# Patient Record
Sex: Male | Born: 1965 | Race: White | Hispanic: No | Marital: Married
Health system: Southern US, Community
[De-identification: ages and names within clinical notes are randomized; demographics above are authoritative.]

## PROBLEM LIST (undated history)

## (undated) DIAGNOSIS — I10 Essential (primary) hypertension: Secondary | ICD-10-CM

---

## 2003-05-30 ENCOUNTER — Emergency Department (HOSPITAL_COMMUNITY): Admission: EM | Admit: 2003-05-30 | Discharge: 2003-05-30 | Payer: Self-pay | Admitting: Emergency Medicine

## 2003-05-30 ENCOUNTER — Encounter: Payer: Self-pay | Admitting: Emergency Medicine

## 2013-08-09 ENCOUNTER — Emergency Department: Payer: Self-pay | Admitting: Emergency Medicine

## 2016-06-24 ENCOUNTER — Emergency Department: Payer: Commercial Managed Care - PPO

## 2016-06-24 ENCOUNTER — Emergency Department
Admission: EM | Admit: 2016-06-24 | Discharge: 2016-06-24 | Disposition: A | Discharge status: Home / Self Care | Payer: Commercial Managed Care - PPO | Attending: Emergency Medicine | Admitting: Emergency Medicine

## 2016-06-24 DIAGNOSIS — S8392XA Sprain of unspecified site of left knee, initial encounter: Secondary | ICD-10-CM

## 2016-06-24 DIAGNOSIS — M25562 Pain in left knee: Secondary | ICD-10-CM | POA: Diagnosis present

## 2016-06-24 DIAGNOSIS — Y998 Other external cause status: Secondary | ICD-10-CM | POA: Insufficient documentation

## 2016-06-24 DIAGNOSIS — Y9344 Activity, trampolining: Secondary | ICD-10-CM | POA: Insufficient documentation

## 2016-06-24 DIAGNOSIS — I10 Essential (primary) hypertension: Secondary | ICD-10-CM | POA: Insufficient documentation

## 2016-06-24 DIAGNOSIS — Y929 Unspecified place or not applicable: Secondary | ICD-10-CM | POA: Diagnosis not present

## 2016-06-24 DIAGNOSIS — X501XXA Overexertion from prolonged static or awkward postures, initial encounter: Secondary | ICD-10-CM | POA: Insufficient documentation

## 2016-06-24 HISTORY — DX: Essential (primary) hypertension: I10

## 2016-06-24 MED ORDER — NAPROXEN 500 MG PO TABS: 500.0000 mg | ORAL_TABLET | Freq: Two times a day (BID) | ORAL | 0 refills | Status: AC

## 2016-06-24 NOTE — ED Notes (Signed)
States he developed pain to left knee after jumping on trampoline last pm  Thinks he may have hyper extended his knee  Pain is lateral  Min swelling

## 2016-06-24 NOTE — ED Provider Notes (Signed)
[facility] Emergency Department Provider Note  ____________________________________________  Time seen: Approximately 10:02 AM  I have reviewed the triage vital signs and the nursing notes.   HISTORY  Chief Complaint Knee Pain    HPI Angel Ferguson is a 50 y.o. male, NAD, presents to the emergency department for evaluation of left knee pain. Patient states that he was jumping on the trampoline with his grandson yesterday when he landed "weird". Patient thinks that he may have hyperextended his knee. After the incident the patient went home and mowed the lawn with only minimal pain. He woke up this AM to a stiff and very painful medial aspect of his left knee. He also believes that the knee looks to be swollen. He mentions that this same knee has been giving him problems the past few months. He believes he may have injured it at work since he does repetitive twisting motion throughout the day. Patient has not iced or taken any pain medications for the new injury. He denies numbness or tingling throughout the left extremity. Pain is currently 6/10. Has not noted any redness, warmth, skin sores about the left lower extremity. Denies chest pain nor shortness of breath.   Past Medical History:  Diagnosis Date  . Hypertension     There are no active problems to display for this patient.   History reviewed. No pertinent surgical history.    Allergies Review of patient's allergies indicates no known allergies.  No family history on file.  Social History Social History  Substance Use Topics  . Smoking status: Not on file  . Smokeless tobacco: Not on file  . Alcohol use Not on file     Review of Systems  Constitutional: No fatigue Cardiovascular: No chest pain. Respiratory: No shortness of breath.  Gastrointestinal: No abdominal pain.  No nausea, vomiting.   Musculoskeletal: Positive left knee pain. Skin: Positive swelling medial left knee. Negative  for rash. Neurological: Negative for headaches, focal weakness or numbness. No tingling.  10-point ROS otherwise negative.  ____________________________________________   PHYSICAL EXAM:  VITAL SIGNS: ED Triage Vitals  Enc Vitals Group     BP 06/24/16 0935 (!) 162/98     Pulse Rate 06/24/16 0935 78     Resp 06/24/16 0935 17     Temp 06/24/16 0935 97.7 F (36.5 C)     Temp Source 06/24/16 0935 Oral     SpO2 06/24/16 0935 99 %     Weight 06/24/16 0935 230 lb (104.3 kg)     Height 06/24/16 0935 6\' 2"  (1.88 m)     Head Circumference --      Peak Flow --      Pain Score 06/24/16 0936 6     Pain Loc --      Pain Edu? --      Excl. in GC? --      Constitutional: Alert and oriented. Well appearing and in no acute distress. Eyes: Conjunctivae are normal.  Head: Atraumatic. Cardiovascular:  Good peripheral circulation with 2+ pulses noted in the left lower extremity. Respiratory: Normal respiratory effort without tachypnea or retractions.  Musculoskeletal: Full range of motion of the left knee but with pain with full flexion. Tenderness to palpation about the medial portion of the knee. No laxity with anterior posterior drawer. No laxity with varus or valgus stress. No masses or abnormalities noted to palpation of the posterior knee. No lower extremity tenderness nor edema.  No joint effusions. Neurologic:  Normal speech  and language. No gross focal neurologic deficits are appreciated. Sensation to light touch grossly intact about the left lower extremity. Skin:  Skin is warm, dry and intact. No rash, bruising, open wounds, skin sores noted. Psychiatric: Mood and affect are normal. Speech and behavior are normal. Patient exhibits appropriate insight and judgement.   ____________________________________________   LABS  None ____________________________________________  EKG  None ____________________________________________  RADIOLOGY I have personally viewed and evaluated  these images (plain radiographs) as part of my medical decision making, as well as reviewing the written report by the radiologist.  Dg Knee Complete 4 Views Left  Result Date: 06/24/2016 CLINICAL DATA:  Pain after jumping on trampoline EXAM: LEFT KNEE - COMPLETE 4+ VIEW COMPARISON:  None. FINDINGS: Standing frontal, standing tunnel, standing lateral, and sunrise patellar images were obtained. There is no apparent fracture or dislocation. No joint effusion. The joint spaces appear unremarkable. No erosive change. IMPRESSION: No fracture or joint effusion.  No apparent arthropathy. Electronically Signed   By: Bretta Bang III M.D.   On: 06/24/2016 10:05   ____________________________________________    PROCEDURES  Procedure(s) performed: None    Medications - No data to display   ____________________________________________   INITIAL IMPRESSION / ASSESSMENT AND PLAN / ED COURSE  Pertinent labs & imaging results that were available during my care of the patient were reviewed by me and considered in my medical decision making (see chart for details).  Patient's diagnosis is consistent with left knee sprain. Patient will be discharged home with prescriptions for naproxen to take as directed. Patient advised to purchase a neoprene knee brace over-the-counter to use when weightbearing. Patient is to follow up with Dr. Joice Lofts in orthopedics within the next week for further evaluation and treatment. Patient is given ED precautions to return to the ED for any worsening or new symptoms.      ____________________________________________  FINAL CLINICAL IMPRESSION(S) / ED DIAGNOSES  Final diagnoses:  Left knee sprain, initial encounter      NEW MEDICATIONS STARTED DURING THIS VISIT:  Discharge Medication List as of 06/24/2016 10:24 AM    START taking these medications   Details  naproxen (NAPROSYN) 500 MG tablet Take 1 tablet (500 mg total) by mouth 2 (two) times daily with a  meal., Starting Tue 06/24/2016, Print             Hope Pigeon, PA-C 06/24/16 1348    Minna Antis, MD 06/24/16 1506

## 2016-06-24 NOTE — ED Triage Notes (Signed)
Pt c/o left knee pain after injuring it on the trampoline yesterday.

## 2017-10-05 IMAGING — CR DG KNEE COMPLETE 4+V*L*
1 series · 4 of 4 positions shown · non-contrast
Comparison: None.

CLINICAL DATA: Pain after jumping on trampoline

EXAM:
LEFT KNEE - COMPLETE 4+ VIEW

[Series 1: dg knee complete 4 views left · 0.14mm/px · 4 of 4 slices shown]
[im 1/4]
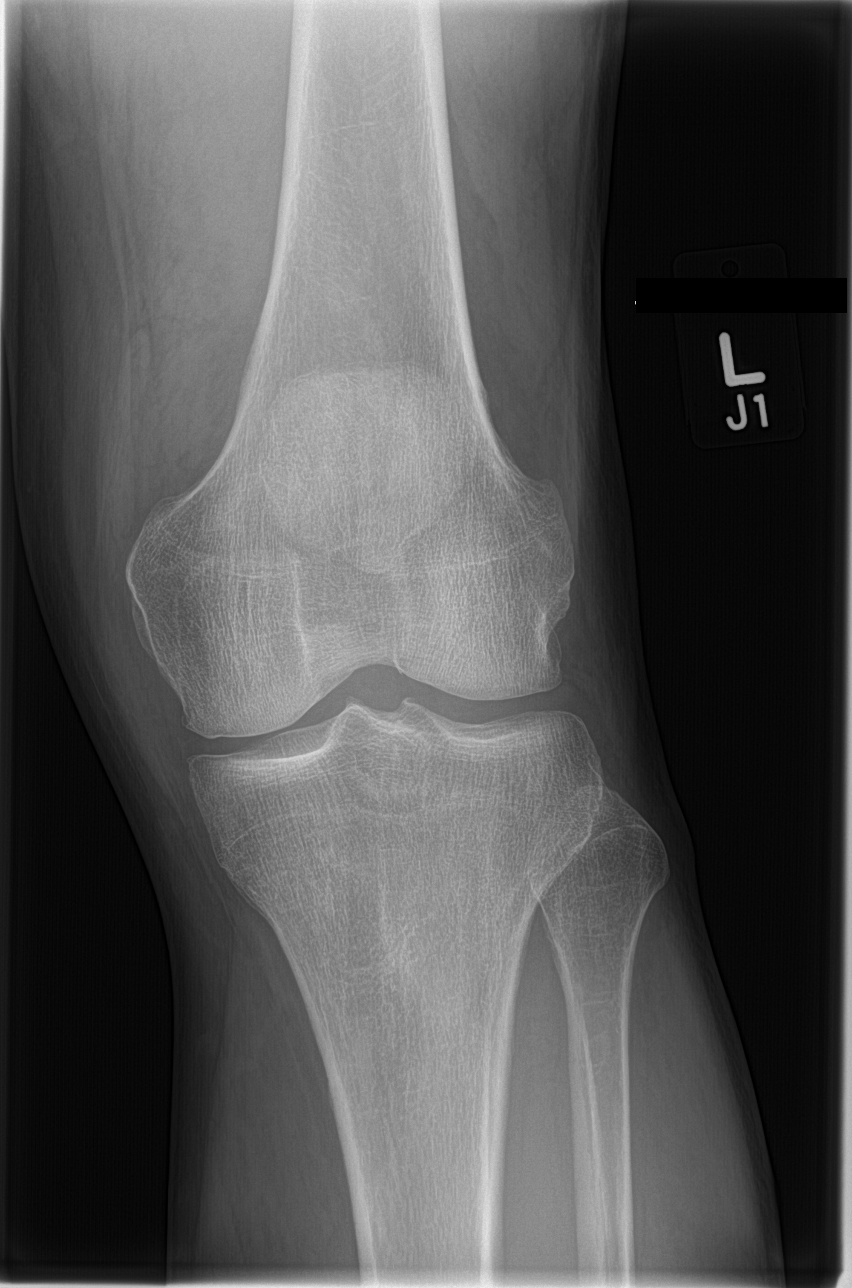
[im 2/4]
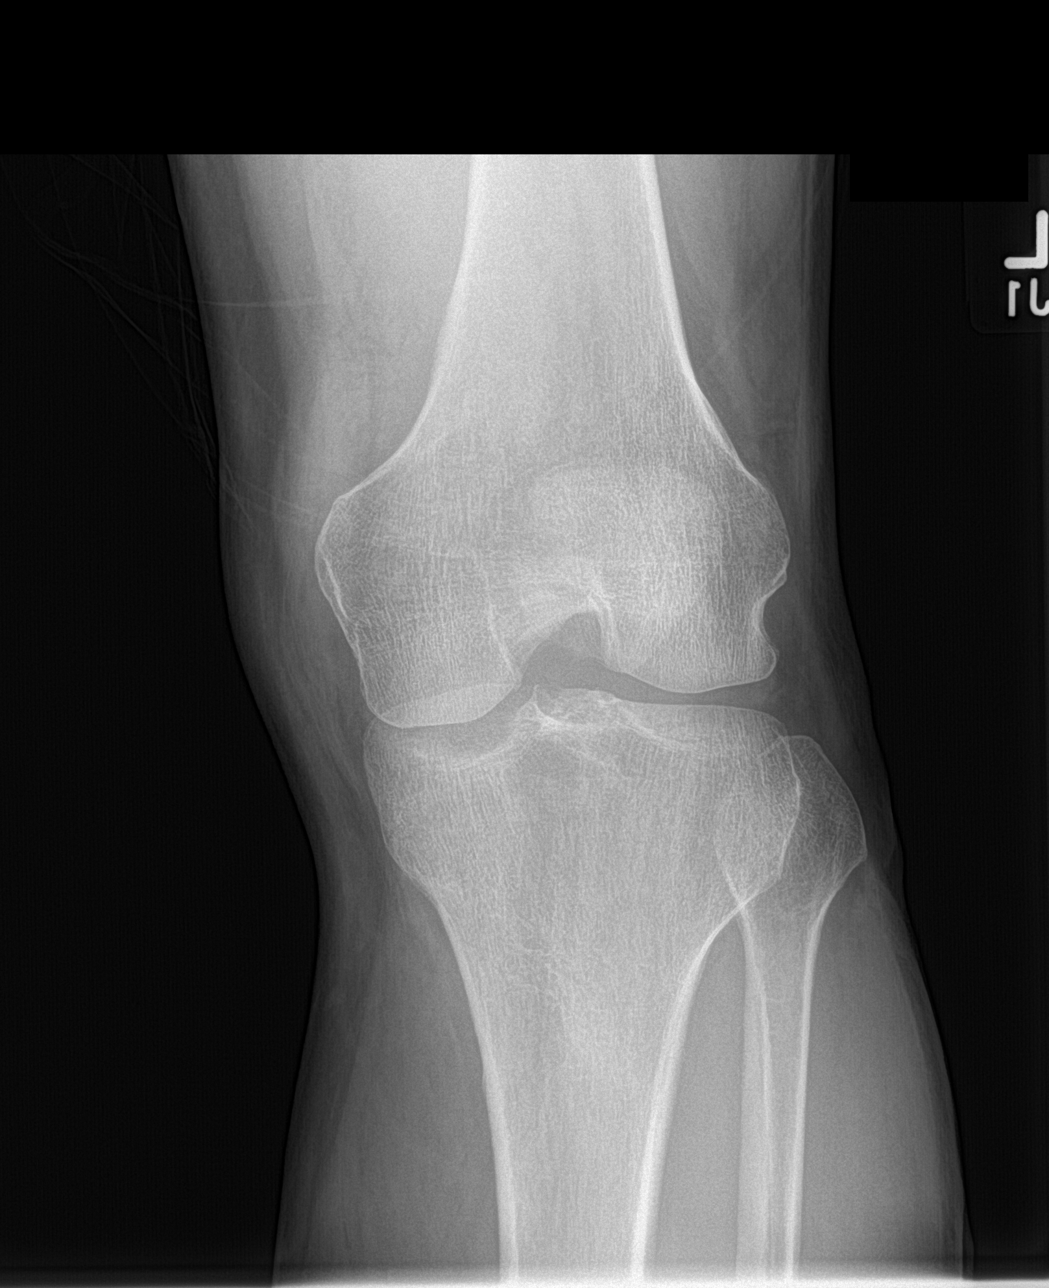
[im 3/4]
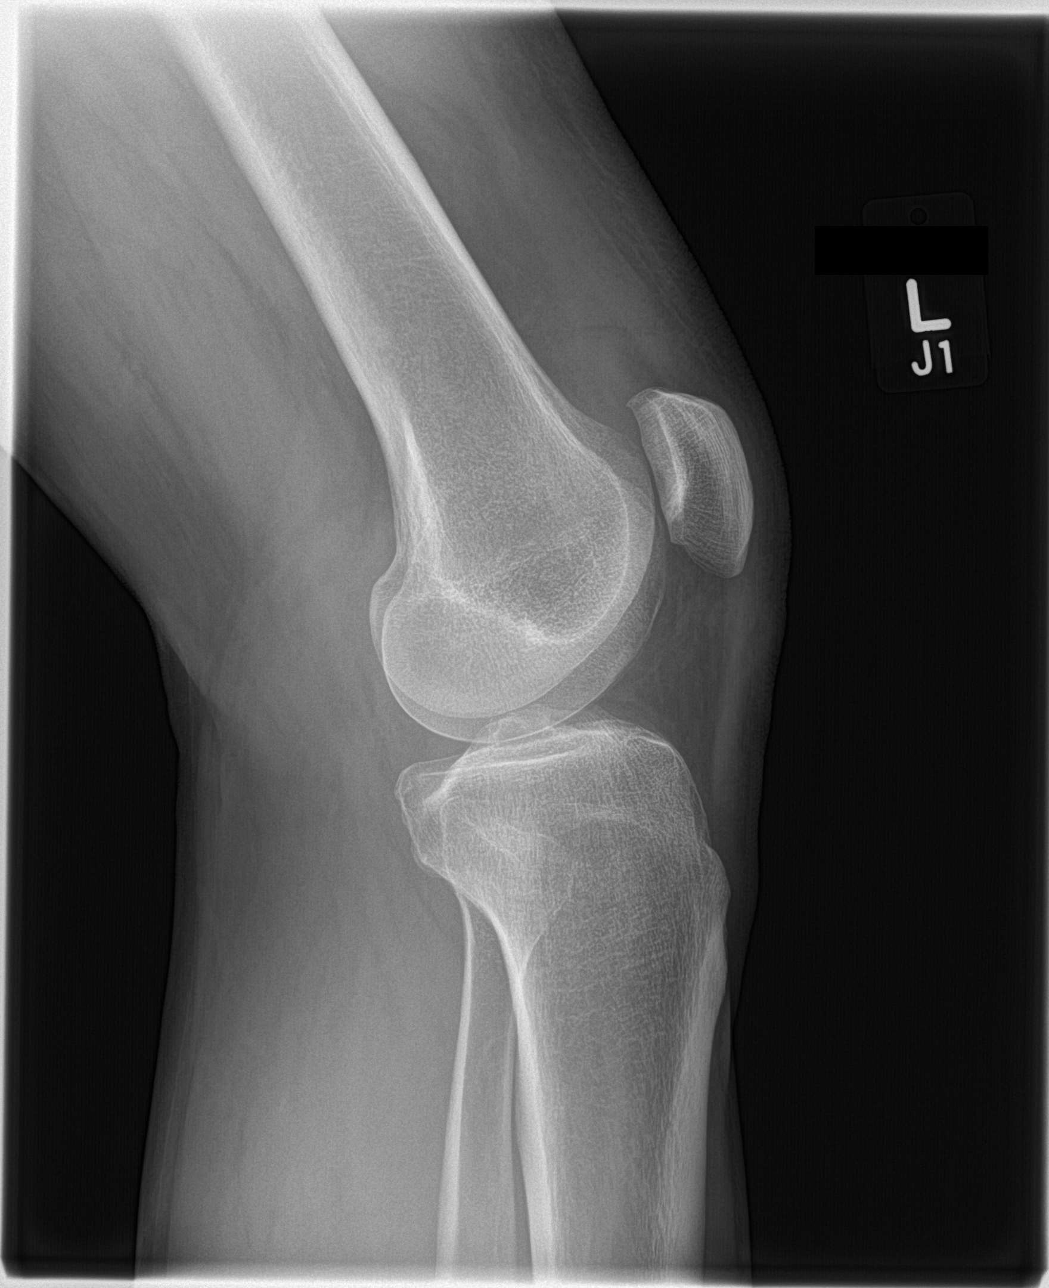
[im 4/4]
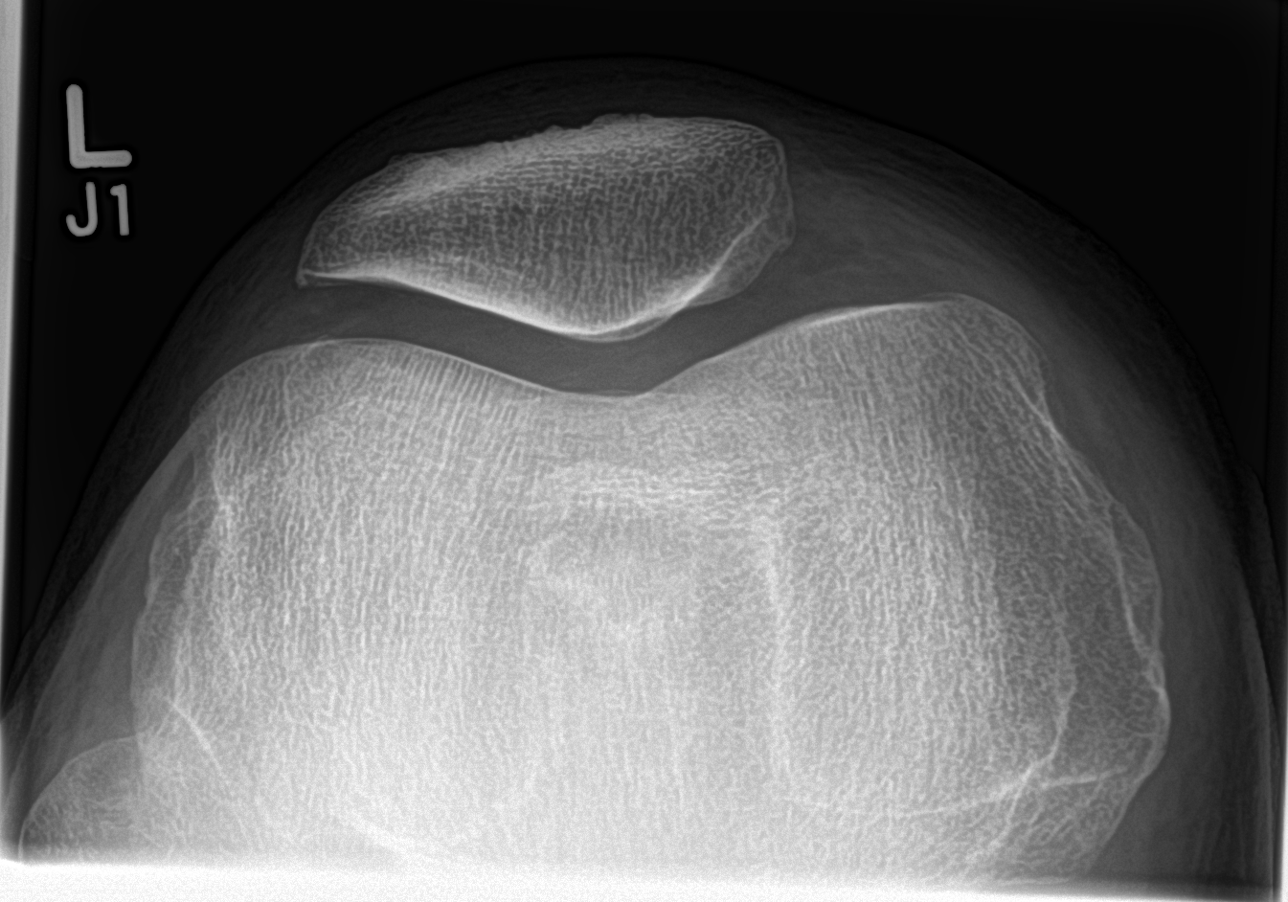

[4 of 4 positions shown; findings below may reference images not displayed]

FINDINGS: Standing frontal, standing tunnel, standing lateral, and sunrise
patellar images were obtained. There is no apparent fracture or
dislocation. No joint effusion. The joint spaces appear
unremarkable. No erosive change.
IMPRESSION: No fracture or joint effusion.  No apparent arthropathy.
